# Patient Record
Sex: Male | Born: 1955 | Race: White | Hispanic: No | Smoking: Never smoker
Health system: Southern US, Community
[De-identification: ages and names within clinical notes are randomized; demographics above are authoritative.]

## PROBLEM LIST (undated history)

## (undated) HISTORY — PX: APPENDECTOMY: SHX54

---

## 2005-03-03 ENCOUNTER — Ambulatory Visit: Payer: Self-pay | Admitting: Gastroenterology

## 2008-12-10 ENCOUNTER — Emergency Department: Payer: Self-pay | Admitting: Internal Medicine

## 2010-12-18 IMAGING — CR DG CHEST 2V
1 series · 2 of 2 positions shown · non-contrast
Comparison: none

REASON FOR EXAM: TRAUMA
COMMENTS:

[Series 1: view not recorded · 0.17mm/px · 2 of 2 slices shown]
[im 1/2]
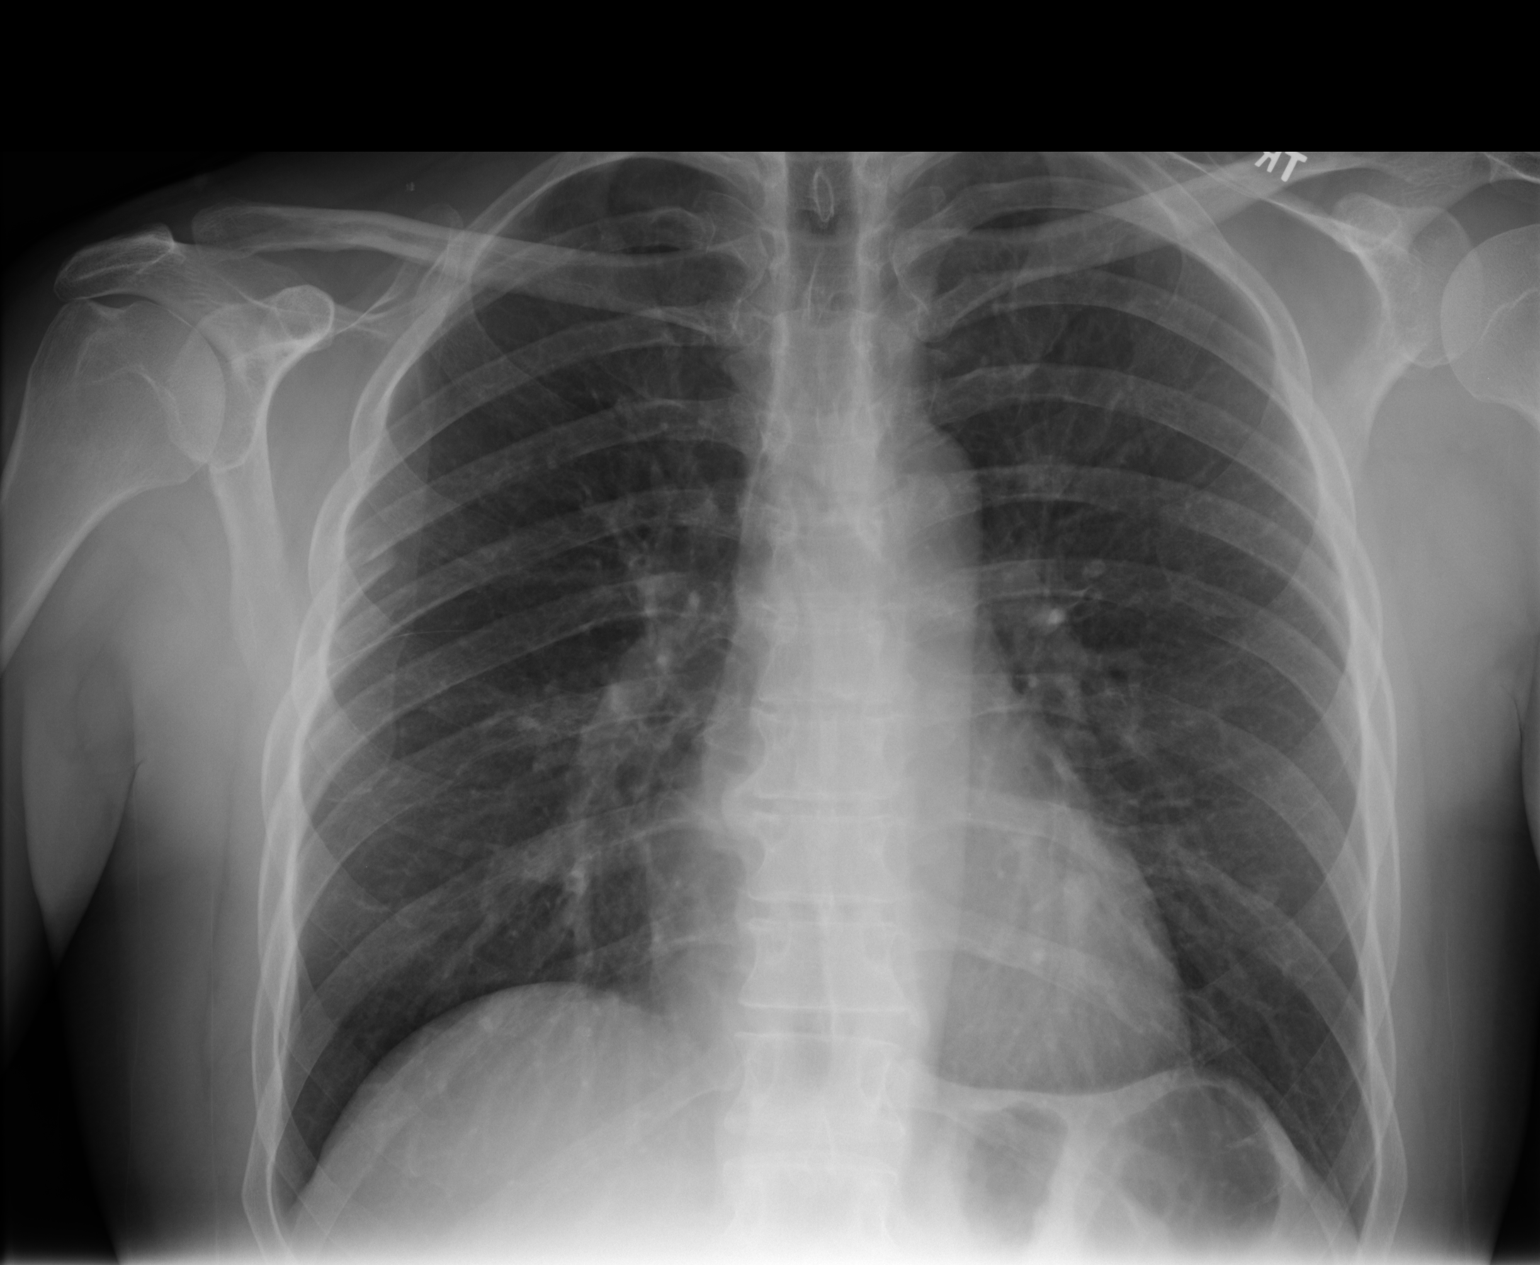
[im 2/2]
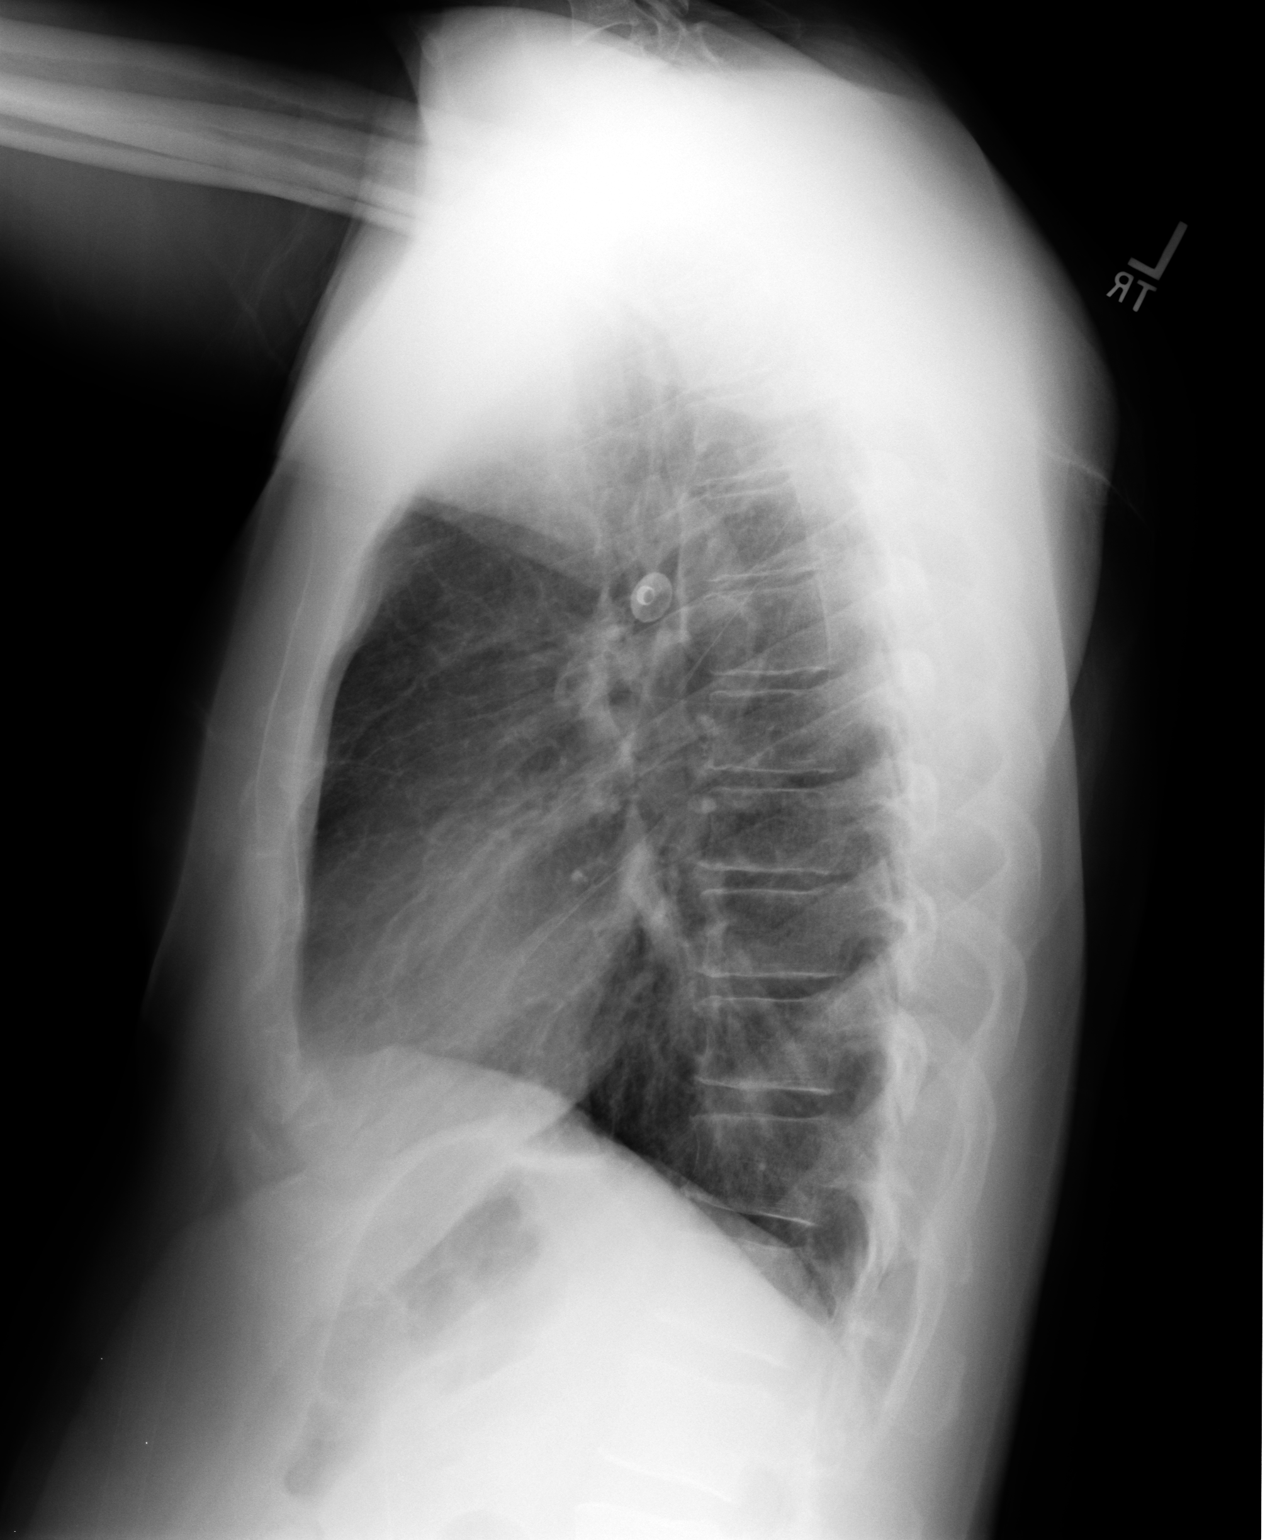

[2 of 2 positions shown; findings below may reference images not displayed]

PROCEDURE:     DXR - DXR CHEST PA (OR AP) AND LATERAL  - December 10, 2008  [DATE]

RESULT:     The lung fields are clear. No pneumonia, pneumothorax or pleural
effusion is seen. There is a slightly displaced fracture of the fifth, right
rib along its posterior lateral aspect. No other rib fractures are seen.
IMPRESSION: 1.  The lung fields are clear.
2.  Fracture of the fifth, right rib.

## 2013-02-22 ENCOUNTER — Ambulatory Visit: Payer: Self-pay | Admitting: Internal Medicine

## 2015-07-02 ENCOUNTER — Other Ambulatory Visit: Payer: Self-pay | Admitting: Family Medicine

## 2015-07-02 DIAGNOSIS — K4090 Unilateral inguinal hernia, without obstruction or gangrene, not specified as recurrent: Secondary | ICD-10-CM

## 2015-07-08 ENCOUNTER — Ambulatory Visit
Admission: RE | Admit: 2015-07-08 | Discharge: 2015-07-08 | Disposition: A | Discharge status: Home / Self Care | Payer: BLUE CROSS/BLUE SHIELD | Source: Ambulatory Visit | Attending: Family Medicine | Admitting: Family Medicine

## 2015-07-08 ENCOUNTER — Ambulatory Visit: Payer: Self-pay

## 2015-07-08 DIAGNOSIS — K4090 Unilateral inguinal hernia, without obstruction or gangrene, not specified as recurrent: Secondary | ICD-10-CM | POA: Diagnosis present

## 2015-07-08 DIAGNOSIS — R590 Localized enlarged lymph nodes: Secondary | ICD-10-CM | POA: Insufficient documentation

## 2016-10-18 ENCOUNTER — Ambulatory Visit
Admission: RE | Admit: 2016-10-18 | Discharge: 2016-10-18 | Disposition: A | Discharge status: Home / Self Care | Payer: BLUE CROSS/BLUE SHIELD | Source: Ambulatory Visit | Attending: Unknown Physician Specialty | Admitting: Unknown Physician Specialty

## 2016-10-18 ENCOUNTER — Encounter
Admission: RE | Disposition: A | Discharge status: Home / Self Care | Payer: Self-pay | Source: Ambulatory Visit | Attending: Unknown Physician Specialty

## 2016-10-18 ENCOUNTER — Encounter: Payer: Self-pay | Admitting: *Deleted

## 2016-10-18 ENCOUNTER — Ambulatory Visit: Payer: BLUE CROSS/BLUE SHIELD | Admitting: Anesthesiology

## 2016-10-18 DIAGNOSIS — K641 Second degree hemorrhoids: Secondary | ICD-10-CM | POA: Insufficient documentation

## 2016-10-18 DIAGNOSIS — Z7982 Long term (current) use of aspirin: Secondary | ICD-10-CM | POA: Insufficient documentation

## 2016-10-18 DIAGNOSIS — Z1211 Encounter for screening for malignant neoplasm of colon: Secondary | ICD-10-CM | POA: Insufficient documentation

## 2016-10-18 HISTORY — PX: COLONOSCOPY WITH PROPOFOL: SHX5780

## 2016-10-18 SURGERY — COLONOSCOPY WITH PROPOFOL
Anesthesia: General

## 2016-10-18 MED ORDER — LIDOCAINE HCL (PF) 2 % IJ SOLN
INTRAMUSCULAR | Status: AC
  Filled 2016-10-18: qty 2

## 2016-10-18 MED ORDER — MIDAZOLAM HCL 2 MG/2ML IJ SOLN
INTRAMUSCULAR | Status: AC
  Filled 2016-10-18: qty 2

## 2016-10-18 MED ORDER — EPHEDRINE SULFATE 50 MG/ML IJ SOLN
INTRAMUSCULAR | Status: DC | PRN
  Administered 2016-10-18: 5 mg via INTRAVENOUS

## 2016-10-18 MED ORDER — PROPOFOL 500 MG/50ML IV EMUL
INTRAVENOUS | Status: AC
  Filled 2016-10-18: qty 50

## 2016-10-18 MED ORDER — PROPOFOL 10 MG/ML IV BOLUS
INTRAVENOUS | Status: DC | PRN
  Administered 2016-10-18: 40 mg via INTRAVENOUS
  Administered 2016-10-18: 10 mg via INTRAVENOUS
  Administered 2016-10-18: 50 mg via INTRAVENOUS

## 2016-10-18 MED ORDER — SODIUM CHLORIDE 0.9 % IV SOLN
INTRAVENOUS | Status: DC
  Administered 2016-10-18: 1000 mL via INTRAVENOUS

## 2016-10-18 MED ORDER — PROPOFOL 500 MG/50ML IV EMUL
INTRAVENOUS | Status: DC | PRN
  Administered 2016-10-18: 140 ug/kg/min via INTRAVENOUS

## 2016-10-18 MED ORDER — SODIUM CHLORIDE 0.9 % IV SOLN: INTRAVENOUS | Status: DC

## 2016-10-18 MED ORDER — MIDAZOLAM HCL 2 MG/2ML IJ SOLN
INTRAMUSCULAR | Status: DC | PRN
  Administered 2016-10-18: 1 mg via INTRAVENOUS

## 2016-10-18 NOTE — Anesthesia Procedure Notes (Signed)
Date/Time: 10/18/2016 3:30 PM Performed by: Karoline Caldwell Pre-anesthesia Checklist: Patient identified, Emergency Drugs available, Suction available and Patient being monitored Oxygen Delivery Method: Nasal cannula

## 2016-10-18 NOTE — Anesthesia Post-op Follow-up Note (Cosign Needed)
Anesthesia QCDR form completed.        

## 2016-10-18 NOTE — Anesthesia Preprocedure Evaluation (Signed)
Anesthesia Evaluation  Patient identified by MRN, date of birth, ID band Patient awake    Reviewed: Allergy & Precautions, H&P , NPO status , Patient's Chart, lab work & pertinent test results, reviewed documented beta blocker date and time   Airway Mallampati: I  TM Distance: >3 FB Neck ROM: full    Dental  (+) Teeth Intact   Pulmonary neg pulmonary ROS,           Cardiovascular Exercise Tolerance: Good negative cardio ROS       Neuro/Psych negative neurological ROS  negative psych ROS   GI/Hepatic negative GI ROS, Neg liver ROS,   Endo/Other  negative endocrine ROS  Renal/GU negative Renal ROS  negative genitourinary   Musculoskeletal   Abdominal   Peds  Hematology negative hematology ROS (+)   Anesthesia Other Findings History reviewed. No pertinent past medical history.   Reproductive/Obstetrics negative OB ROS                             Anesthesia Physical Anesthesia Plan  ASA: I  Anesthesia Plan: General   Post-op Pain Management:    Induction:   Airway Management Planned:   Additional Equipment:   Intra-op Plan:   Post-operative Plan:   Informed Consent: I have reviewed the patients History and Physical, chart, labs and discussed the procedure including the risks, benefits and alternatives for the proposed anesthesia with the patient or authorized representative who has indicated his/her understanding and acceptance.   Dental Advisory Given  Plan Discussed with: Anesthesiologist, CRNA and Surgeon  Anesthesia Plan Comments:         Anesthesia Quick Evaluation

## 2016-10-18 NOTE — H&P (Signed)
   Primary Care Physician:  No PCP Per Patient Primary Gastroenterologist:  Dr. Mechele Collin  Pre-Procedure History & Physical: HPI:  Andrew Hahn is a 61 y.o. male is here for an colonoscopy.   History reviewed. No pertinent past medical history.  Past Surgical History:  Procedure Laterality Date  . APPENDECTOMY      Prior to Admission medications   Medication Sig Start Date End Date Taking? Authorizing Provider  aspirin 81 MG chewable tablet Chew 81 mg by mouth daily.   Yes Historical Provider, MD    Allergies as of 10/18/2016  . (No Known Allergies)    History reviewed. No pertinent family history.  Social History   Social History  . Marital status: Divorced    Spouse name: N/A  . Number of children: N/A  . Years of education: N/A   Occupational History  . Not on file.   Social History Main Topics  . Smoking status: Never Smoker  . Smokeless tobacco: Never Used  . Alcohol use 3.0 oz/week    5 Cans of beer per week  . Drug use: No  . Sexual activity: Not on file   Other Topics Concern  . Not on file   Social History Narrative  . No narrative on file    Review of Systems: See HPI, otherwise negative ROS  Physical Exam: BP 140/84   Pulse (!) 53   Temp 97.5 F (36.4 C) (Tympanic)   Resp 20   Ht  (1.676 m)   Wt 71.2 kg (157 lb)   SpO2 100%   BMI 25.34 kg/m  General:   Alert,  pleasant and cooperative in NAD Head:  Normocephalic and atraumatic. Neck:  Supple; no masses or thyromegaly. Lungs:  Clear throughout to auscultation.    Heart:  Regular rate and rhythm. Abdomen:  Soft, nontender and nondistended. Normal bowel sounds, without guarding, and without rebound.   Neurologic:  Alert and  oriented x4;  grossly normal neurologically.  Impression/Plan: Andrew Hahn is here for an colonoscopy to be performed for screening.  Risks, benefits, limitations, and alternatives regarding  colonoscopy have been reviewed with the patient.  Questions  have been answered.  All parties agreeable.   Lynnae Prude, MD  10/18/2016, 3:25 PM

## 2016-10-18 NOTE — Transfer of Care (Signed)
Immediate Anesthesia Transfer of Care Note  Patient: Andrew Hahn  Procedure(s) Performed: Procedure(s): COLONOSCOPY WITH PROPOFOL (N/A)  Patient Location: PACU  Anesthesia Type:General  Level of Consciousness: awake, alert  and oriented  Airway & Oxygen Therapy: Patient Spontanous Breathing and Patient connected to nasal cannula oxygen  Post-op Assessment: Report given to RN and Post -op Vital signs reviewed and stable  Post vital signs: Reviewed and stable  Last Vitals:  Vitals:   10/18/16 1439 10/18/16 1559  BP: 140/84 99/77  Pulse: (!) 53 63  Resp: 20 13  Temp: 36.4 C (!) 35.7 C    Last Pain:  Vitals:   10/18/16 1559  TempSrc: Tympanic         Complications: No apparent anesthesia complications

## 2016-10-18 NOTE — Op Note (Signed)
[facility] Gastroenterology Patient Name: Andrew Hahn Procedure Date: 10/18/2016 3:21 PM MRN: [id number] Account #: [id number] Date of Birth: 28-Jan-1956 Admit Type: Outpatient Age: 61 Room: [facility] ENDO ROOM 3 Gender: Male Note Status: Finalized Procedure:            Colonoscopy Indications:          Screening for colorectal malignant neoplasm Providers:            Scot Jun, MD Referring MD:         Neomia Dear. Harrington Challenger, MD (Referring MD) Medicines:            Propofol per Anesthesia Complications:        No immediate complications. Procedure:            Pre-Anesthesia Assessment:                       - After reviewing the risks and benefits, the patient                        was deemed in satisfactory condition to undergo the                        procedure.                       After obtaining informed consent, the colonoscope was                        passed under direct vision. Throughout the procedure,                        the patient's blood pressure, pulse, and oxygen                        saturations were monitored continuously. The                        Colonoscope was introduced through the anus and                        advanced to the the cecum, identified by appendiceal                        orifice and ileocecal valve. The colonoscopy was                        performed without difficulty. The patient tolerated the                        procedure well. The quality of the bowel preparation                        was good. Findings:      Internal hemorrhoids were found during endoscopy. The hemorrhoids were       medium-sized and Grade II (internal hemorrhoids that prolapse but reduce       spontaneously).      The exam was otherwise without abnormality. Impression:           - Internal hemorrhoids.                       -  The examination was otherwise normal.                       - No specimens collected. Recommendation:       -  Repeat colonoscopy in 10 years for screening purposes. Scot Jun, MD 10/18/2016 3:57:46 PM This report has been signed electronically. Number of Addenda: 0 Note Initiated On: 10/18/2016 3:21 PM Scope Withdrawal Time: 0 hours 11 minutes 44 seconds  Total Procedure Duration: 0 hours 20 minutes 8 seconds       [facility]

## 2016-10-19 ENCOUNTER — Encounter: Payer: Self-pay | Admitting: Unknown Physician Specialty

## 2016-10-25 NOTE — Anesthesia Postprocedure Evaluation (Signed)
Anesthesia Post Note  Patient: Andrew Hahn  Procedure(s) Performed: Procedure(s) (LRB): COLONOSCOPY WITH PROPOFOL (N/A)  Patient location during evaluation: Endoscopy Anesthesia Type: General Level of consciousness: awake and alert Pain management: pain level controlled Vital Signs Assessment: post-procedure vital signs reviewed and stable Respiratory status: spontaneous breathing, nonlabored ventilation, respiratory function stable and patient connected to nasal cannula oxygen Cardiovascular status: blood pressure returned to baseline and stable Postop Assessment: no signs of nausea or vomiting Anesthetic complications: no     Last Vitals:  Vitals:   10/18/16 1622 10/18/16 1632  BP: 117/76   Pulse: (!) 53 (!) 54  Resp: 10 14  Temp:      Last Pain:  Vitals:   10/18/16 1602  TempSrc: Tympanic                 [name]

## 2017-03-06 IMAGING — US US EXTREM LOW*R* LIMITED
1 series · 14 of 25 positions shown · non-contrast
Comparison: None

CLINICAL DATA: Right inguinal swelling.  Evaluate for hernia

EXAM:
ULTRASOUND RIGHT LOWER EXTREMITY LIMITED
TECHNIQUE: Ultrasound examination of the lower extremity soft tissues was
performed in the area of clinical concern.

[Series 1: us extrem low*right* limited · 0.07mm/px · 45 acquisitions, 14 frames shown]
[im 1/45]
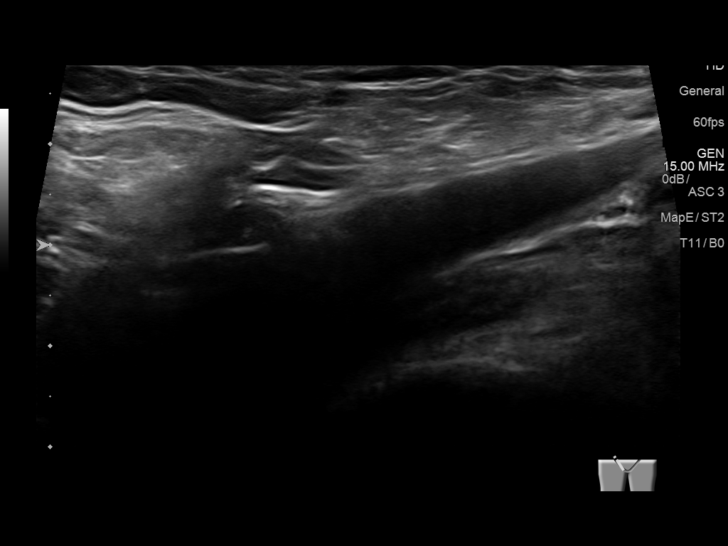
[im 4/45]
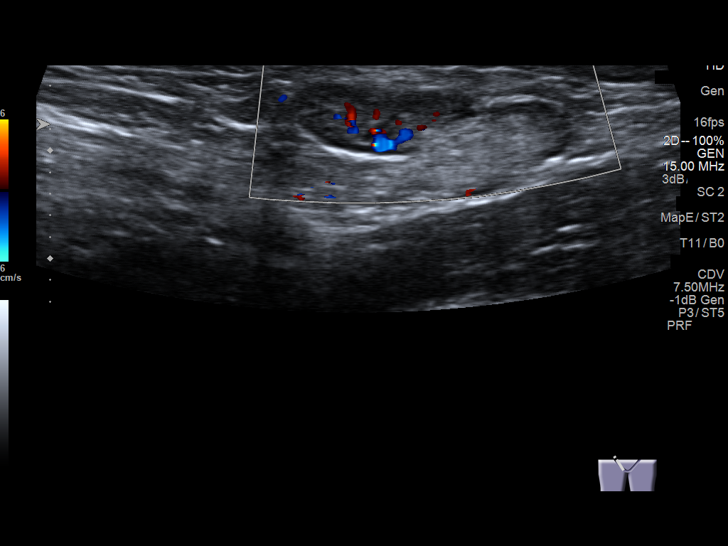
[im 8/45]
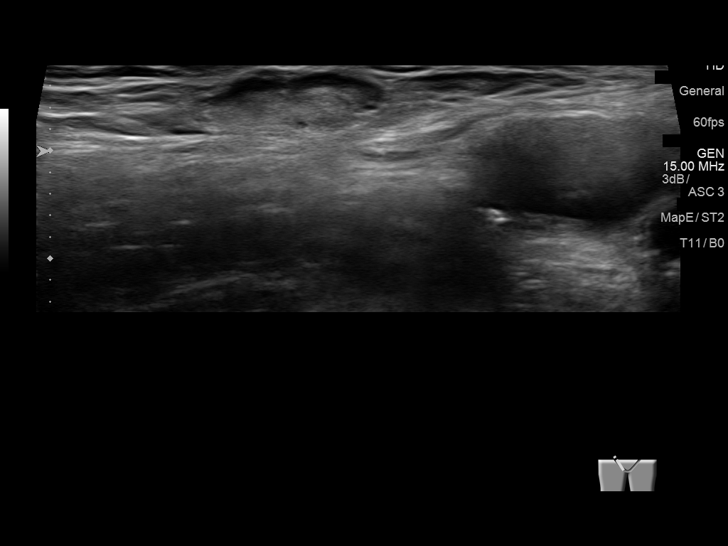
[im 12/45]
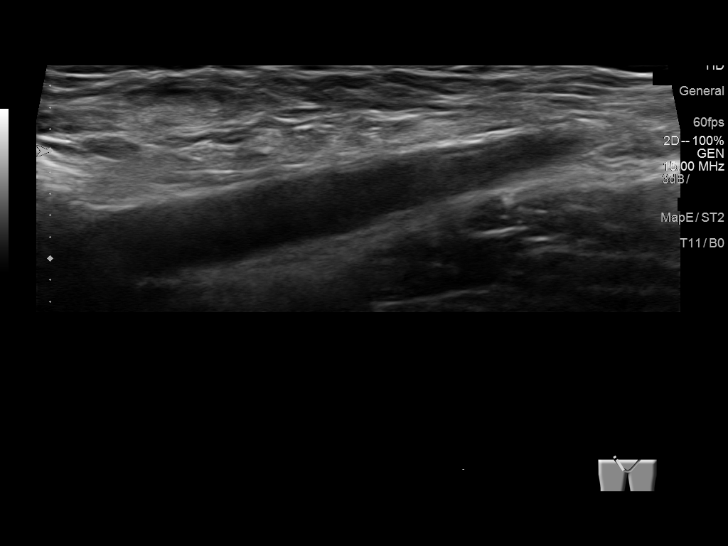
[im 15/45]
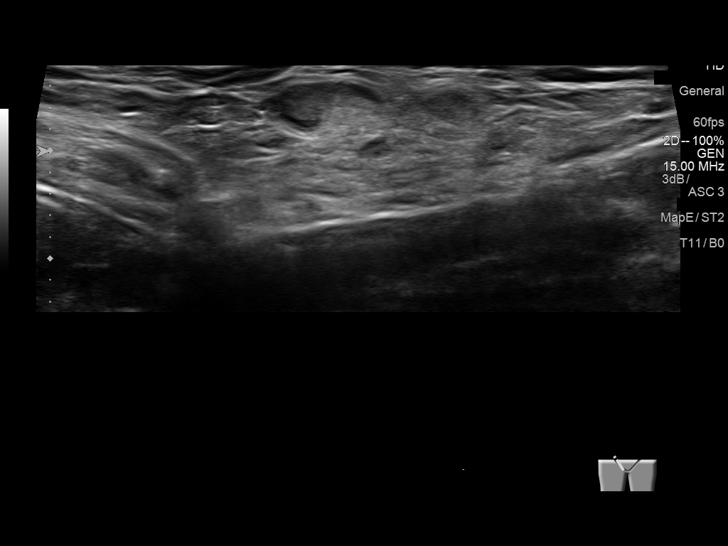
[im 17/45]
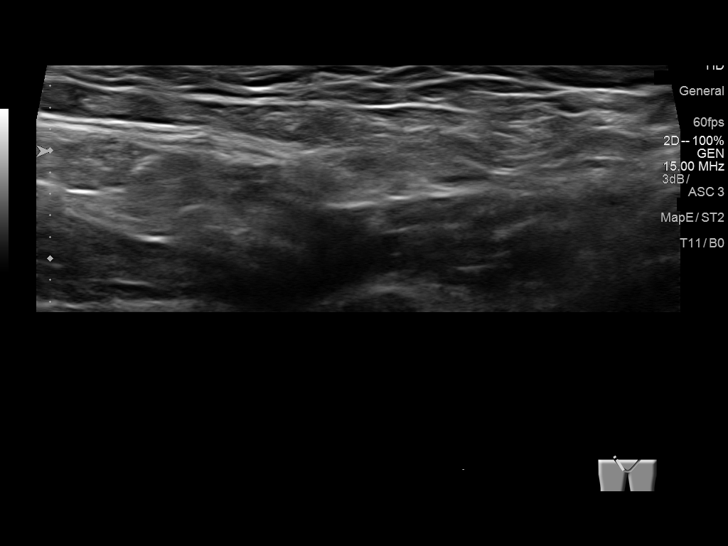
[im 21/45]
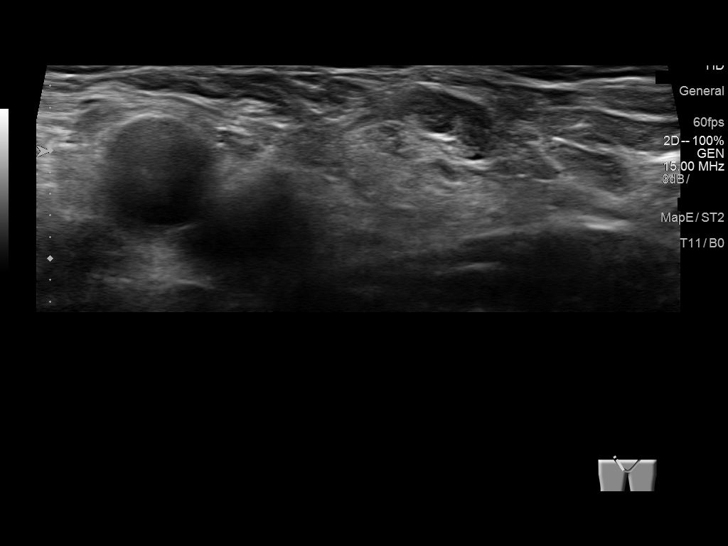
[im 24/45]
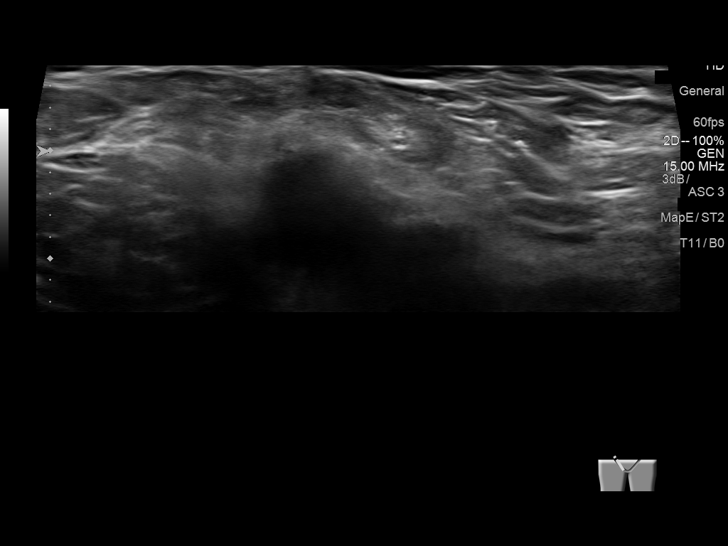
[im 28/45]
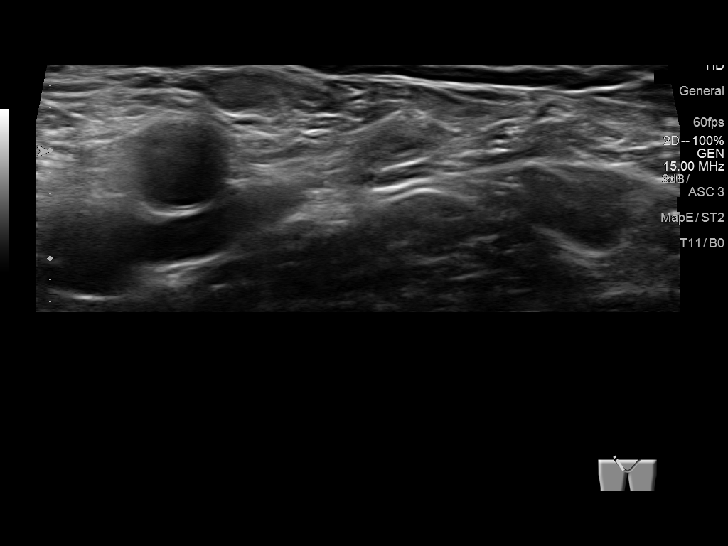
[im 30/45]
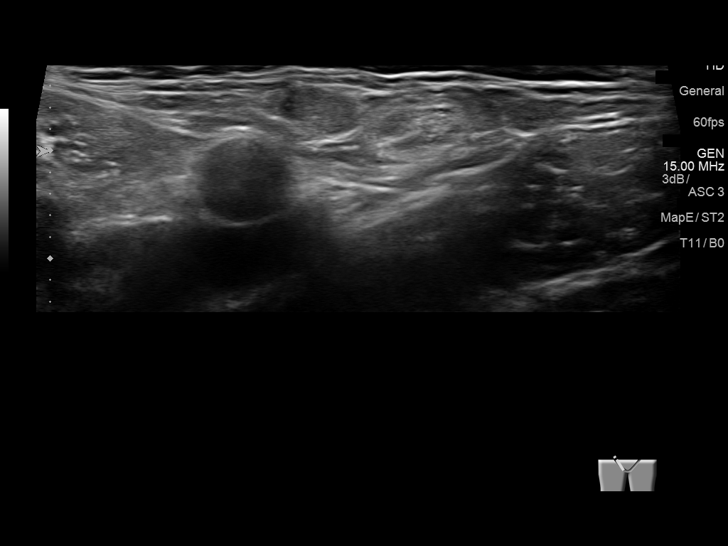
[im 34/45]
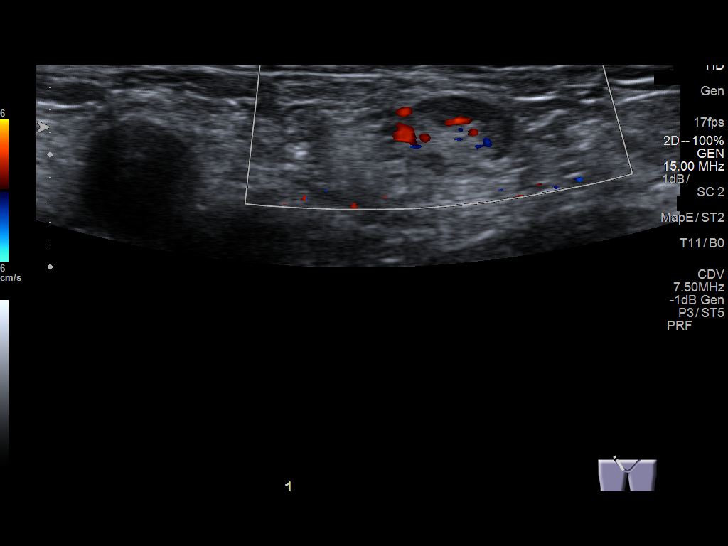
[im 37/45]
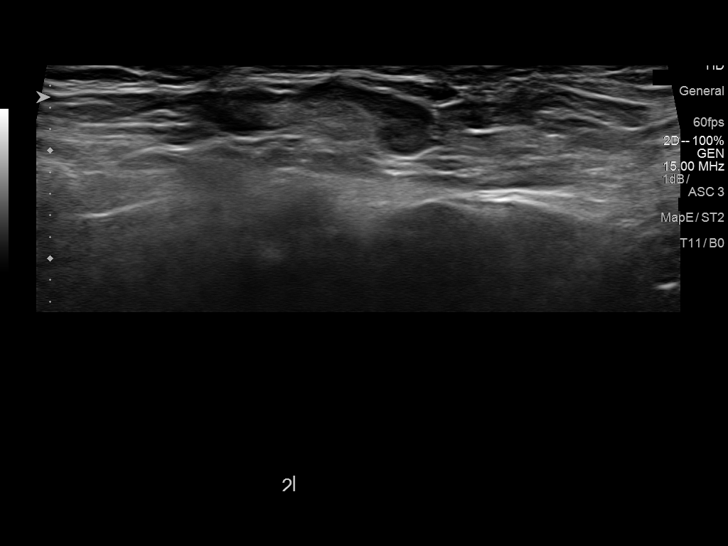
[im 41/45]
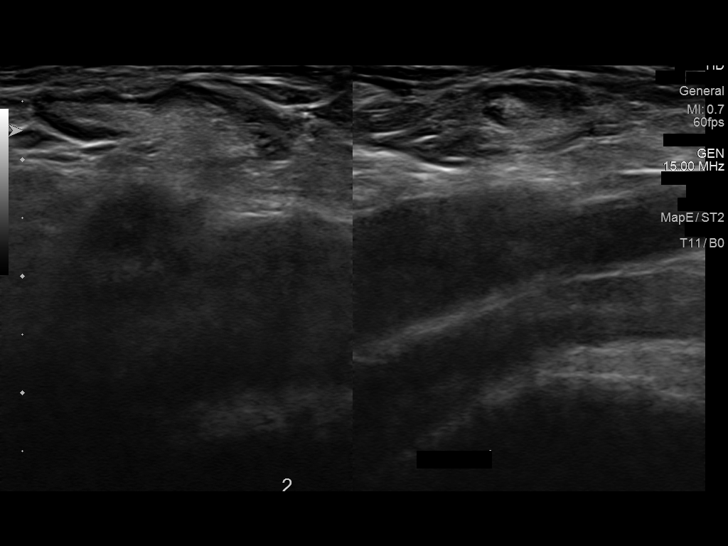
[im 45/45]
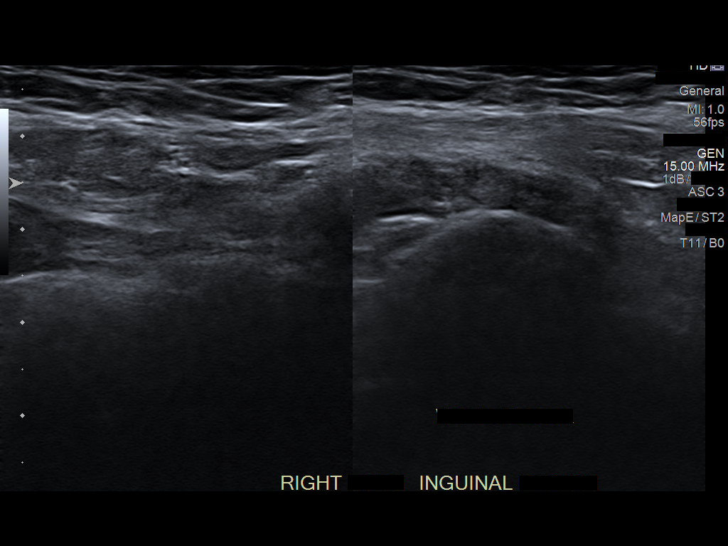

[14 of 25 positions shown; findings below may reference images not displayed]

FINDINGS: Ultrasounds performed in the right inguinal region. No hernia
visualized. Multiple small lymph nodes noted, the largest 2.9 x
x 0.7 cm. These maintain normal central fatty hilum.
IMPRESSION: No evidence for right inguinal hernia. Mildly prominent right
inguinal lymph nodes which maintain a normal central fatty hilum.

## 2020-05-03 ENCOUNTER — Other Ambulatory Visit: Payer: Self-pay

## 2020-05-03 ENCOUNTER — Encounter: Payer: Self-pay | Admitting: Emergency Medicine

## 2020-05-03 ENCOUNTER — Ambulatory Visit
Admission: EM | Admit: 2020-05-03 | Discharge: 2020-05-03 | Disposition: A | Discharge status: Home / Self Care | Payer: BC Managed Care – PPO | Attending: Family Medicine | Admitting: Family Medicine

## 2020-05-03 DIAGNOSIS — M545 Low back pain, unspecified: Secondary | ICD-10-CM | POA: Diagnosis not present

## 2020-05-03 MED ORDER — MELOXICAM 15 MG PO TABS: 15.0000 mg | ORAL_TABLET | Freq: Every day | ORAL | 0 refills | Status: AC | PRN

## 2020-05-03 NOTE — ED Triage Notes (Signed)
Patient c/o left low back pain that started 2 days ago. Denies injury.

## 2020-05-03 NOTE — ED Provider Notes (Signed)
[facility]    CSN: [id number] Arrival date & time: 05/03/20  0843      History   Chief Complaint Chief Complaint  Patient presents with  . Back Pain   HPI  64 year old male presents with left-sided low back pain.  1.5-day history of left-sided low back pain.  Patient states that he has had no injury.  He has not taken any medication for this.  Pain 8/10 in severity.  Pain does not radiate.  States it was worse last night.  He states that he cannot get comfortable in bed.  No abdominal pain.  No other associated symptoms.  No other complaints.  Past Surgical History:  Procedure Laterality Date  . APPENDECTOMY    . COLONOSCOPY WITH PROPOFOL N/A 10/18/2016   Procedure: COLONOSCOPY WITH PROPOFOL;  Surgeon: Scot Jun, MD;  Location: [facility] ENDOSCOPY;  Service: Endoscopy;  Laterality: N/A;    Home Medications    Prior to Admission medications   Medication Sig Start Date End Date Taking? Authorizing Provider  aspirin 81 MG chewable tablet Chew 81 mg by mouth daily.    [provider]  meloxicam (MOBIC) 15 MG tablet Take 1 tablet (15 mg total) by mouth daily as needed. 05/03/20   Tommie Sams, DO    Social History Social History   Tobacco Use  . Smoking status: Never Smoker  . Smokeless tobacco: Never Used  Substance Use Topics  . Alcohol use: Yes    Alcohol/week: 5.0 standard drinks    Types: 5 Cans of beer per week  . Drug use: No     Allergies   Patient has no known allergies.   Review of Systems Review of Systems  Musculoskeletal: Positive for back pain.   Physical Exam Triage Vital Signs ED Triage Vitals  Enc Vitals Group     BP 05/03/20 0915 126/80     Pulse --      Resp 05/03/20 0915 18     Temp 05/03/20 0915 98 F (36.7 C)     Temp Source 05/03/20 0915 Oral     SpO2 05/03/20 0915 100 %     Weight 05/03/20 0914 155 lb (70.3 kg)     Height 05/03/20 0914 5\' 6"  (1.676 m)     Head Circumference --      Peak Flow --       Pain Score 05/03/20 0914 8     Pain Loc --      Pain Edu? --      Excl. in GC? --    Updated Vital Signs BP 126/80 (BP Location: Left Arm)   Temp 98 F (36.7 C) (Oral)   Resp 18   Ht 5\' 6"  (1.676 m)   Wt 70.3 kg   SpO2 100%   BMI 25.02 kg/m   Visual Acuity Right Eye Distance:   Left Eye Distance:   Bilateral Distance:    Right Eye Near:   Left Eye Near:    Bilateral Near:     Physical Exam Constitutional:      General: He is not in acute distress.    Appearance: Normal appearance. He is not ill-appearing.  HENT:     Head: Normocephalic and atraumatic.  Cardiovascular:     Rate and Rhythm: Normal rate and regular rhythm.  Pulmonary:     Effort: Pulmonary effort is normal. No respiratory distress.  Musculoskeletal:     Comments: Lumbar spine -no discrete areas of tenderness.  Neurological:  Mental Status: He is alert.  Psychiatric:        Mood and Affect: Mood normal.        Behavior: Behavior normal.    UC Treatments / Results  Labs (all labs ordered are listed, but only abnormal results are displayed) Labs Reviewed - No data to display  EKG   Radiology No results found.  Procedures Procedures (including critical care time)  Medications Ordered in UC Medications - No data to display  Initial Impression / Assessment and Plan / UC Course  I have reviewed the triage vital signs and the nursing notes.  Pertinent labs & imaging results that were available during my care of the patient were reviewed by me and considered in my medical decision making (see chart for details).    64 year old male presents with low back pain.  Appears to be MSK in nature.  Advised rest and heat.  Meloxicam as needed.  Supportive care.  Final Clinical Impressions(s) / UC Diagnoses   Final diagnoses:  Acute left-sided low back pain without sciatica     Discharge Instructions     Rest, heat.  Medication as directed.  Take care   Dr. Adriana Simas   ED Prescriptions      Medication Sig Dispense Auth. Provider   meloxicam (MOBIC) 15 MG tablet Take 1 tablet (15 mg total) by mouth daily as needed. 30 tablet Tommie Sams, DO     PDMP not reviewed this encounter.   Tommie Sams, DO 05/03/20 1004

## 2020-05-03 NOTE — Discharge Instructions (Signed)
Rest, heat.  Medication as directed.  Take care  Dr. Yer Olivencia  

## 2023-07-24 ENCOUNTER — Other Ambulatory Visit: Payer: Self-pay | Admitting: Orthopedic Surgery

## 2023-07-24 DIAGNOSIS — M25312 Other instability, left shoulder: Secondary | ICD-10-CM

## 2023-07-24 DIAGNOSIS — S46002A Unspecified injury of muscle(s) and tendon(s) of the rotator cuff of left shoulder, initial encounter: Secondary | ICD-10-CM

## 2023-07-30 ENCOUNTER — Encounter: Payer: Self-pay | Admitting: Orthopedic Surgery

## 2023-07-31 ENCOUNTER — Encounter: Payer: Self-pay | Admitting: Orthopedic Surgery

## 2023-08-07 ENCOUNTER — Ambulatory Visit
Admission: RE | Admit: 2023-08-07 | Discharge: 2023-08-07 | Disposition: A | Discharge status: Home / Self Care | Payer: Medicare Other | Source: Ambulatory Visit | Attending: Orthopedic Surgery | Admitting: Orthopedic Surgery

## 2023-08-07 DIAGNOSIS — M25312 Other instability, left shoulder: Secondary | ICD-10-CM

## 2023-08-07 DIAGNOSIS — S46002A Unspecified injury of muscle(s) and tendon(s) of the rotator cuff of left shoulder, initial encounter: Secondary | ICD-10-CM

## 2023-12-05 ENCOUNTER — Other Ambulatory Visit: Payer: Self-pay | Admitting: Internal Medicine

## 2023-12-05 DIAGNOSIS — E785 Hyperlipidemia, unspecified: Secondary | ICD-10-CM

## 2023-12-05 DIAGNOSIS — R002 Palpitations: Secondary | ICD-10-CM

## 2023-12-19 ENCOUNTER — Ambulatory Visit
Admission: RE | Admit: 2023-12-19 | Discharge: 2023-12-19 | Disposition: A | Discharge status: Home / Self Care | Payer: Self-pay | Source: Ambulatory Visit | Attending: Internal Medicine | Admitting: Internal Medicine

## 2023-12-19 DIAGNOSIS — E785 Hyperlipidemia, unspecified: Secondary | ICD-10-CM | POA: Insufficient documentation

## 2023-12-19 DIAGNOSIS — R002 Palpitations: Secondary | ICD-10-CM | POA: Insufficient documentation
